# Patient Record
Sex: Female | Born: 2000 | Race: White | Hispanic: No | Marital: Single | Smoking: Never smoker
Health system: Southern US, Community
[De-identification: ages and names within clinical notes are randomized; demographics above are authoritative.]

---
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
## 2021-01-20 DIAGNOSIS — Z419 Encounter for procedure for purposes other than remedying health state, unspecified: Secondary | ICD-10-CM | POA: Diagnosis not present

## 2021-02-19 DIAGNOSIS — Z419 Encounter for procedure for purposes other than remedying health state, unspecified: Secondary | ICD-10-CM | POA: Diagnosis not present

## 2021-03-22 DIAGNOSIS — Z419 Encounter for procedure for purposes other than remedying health state, unspecified: Secondary | ICD-10-CM | POA: Diagnosis not present

## 2021-04-22 DIAGNOSIS — Z419 Encounter for procedure for purposes other than remedying health state, unspecified: Secondary | ICD-10-CM | POA: Diagnosis not present

## 2021-05-20 DIAGNOSIS — Z419 Encounter for procedure for purposes other than remedying health state, unspecified: Secondary | ICD-10-CM | POA: Diagnosis not present

## 2021-06-20 DIAGNOSIS — Z419 Encounter for procedure for purposes other than remedying health state, unspecified: Secondary | ICD-10-CM | POA: Diagnosis not present

## 2021-07-20 DIAGNOSIS — Z419 Encounter for procedure for purposes other than remedying health state, unspecified: Secondary | ICD-10-CM | POA: Diagnosis not present

## 2021-08-20 DIAGNOSIS — Z419 Encounter for procedure for purposes other than remedying health state, unspecified: Secondary | ICD-10-CM | POA: Diagnosis not present

## 2021-09-19 DIAGNOSIS — Z419 Encounter for procedure for purposes other than remedying health state, unspecified: Secondary | ICD-10-CM | POA: Diagnosis not present

## 2021-10-20 DIAGNOSIS — Z419 Encounter for procedure for purposes other than remedying health state, unspecified: Secondary | ICD-10-CM | POA: Diagnosis not present

## 2021-11-20 DIAGNOSIS — Z419 Encounter for procedure for purposes other than remedying health state, unspecified: Secondary | ICD-10-CM | POA: Diagnosis not present

## 2021-12-20 DIAGNOSIS — Z419 Encounter for procedure for purposes other than remedying health state, unspecified: Secondary | ICD-10-CM | POA: Diagnosis not present

## 2022-01-20 DIAGNOSIS — Z419 Encounter for procedure for purposes other than remedying health state, unspecified: Secondary | ICD-10-CM | POA: Diagnosis not present

## 2022-02-19 DIAGNOSIS — Z419 Encounter for procedure for purposes other than remedying health state, unspecified: Secondary | ICD-10-CM | POA: Diagnosis not present

## 2022-03-22 DIAGNOSIS — Z419 Encounter for procedure for purposes other than remedying health state, unspecified: Secondary | ICD-10-CM | POA: Diagnosis not present

## 2022-04-22 DIAGNOSIS — Z419 Encounter for procedure for purposes other than remedying health state, unspecified: Secondary | ICD-10-CM | POA: Diagnosis not present

## 2022-05-21 DIAGNOSIS — Z419 Encounter for procedure for purposes other than remedying health state, unspecified: Secondary | ICD-10-CM | POA: Diagnosis not present

## 2022-06-21 DIAGNOSIS — Z419 Encounter for procedure for purposes other than remedying health state, unspecified: Secondary | ICD-10-CM | POA: Diagnosis not present

## 2022-07-21 DIAGNOSIS — Z419 Encounter for procedure for purposes other than remedying health state, unspecified: Secondary | ICD-10-CM | POA: Diagnosis not present

## 2022-08-21 DIAGNOSIS — Z419 Encounter for procedure for purposes other than remedying health state, unspecified: Secondary | ICD-10-CM | POA: Diagnosis not present

## 2022-09-20 DIAGNOSIS — Z419 Encounter for procedure for purposes other than remedying health state, unspecified: Secondary | ICD-10-CM | POA: Diagnosis not present

## 2022-10-21 DIAGNOSIS — Z419 Encounter for procedure for purposes other than remedying health state, unspecified: Secondary | ICD-10-CM | POA: Diagnosis not present

## 2022-11-21 DIAGNOSIS — Z419 Encounter for procedure for purposes other than remedying health state, unspecified: Secondary | ICD-10-CM | POA: Diagnosis not present

## 2022-12-21 DIAGNOSIS — Z419 Encounter for procedure for purposes other than remedying health state, unspecified: Secondary | ICD-10-CM | POA: Diagnosis not present

## 2023-01-21 DIAGNOSIS — Z419 Encounter for procedure for purposes other than remedying health state, unspecified: Secondary | ICD-10-CM | POA: Diagnosis not present

## 2023-02-20 DIAGNOSIS — Z419 Encounter for procedure for purposes other than remedying health state, unspecified: Secondary | ICD-10-CM | POA: Diagnosis not present

## 2023-03-23 DIAGNOSIS — Z419 Encounter for procedure for purposes other than remedying health state, unspecified: Secondary | ICD-10-CM | POA: Diagnosis not present

## 2023-04-23 DIAGNOSIS — Z419 Encounter for procedure for purposes other than remedying health state, unspecified: Secondary | ICD-10-CM | POA: Diagnosis not present

## 2023-05-21 DIAGNOSIS — Z419 Encounter for procedure for purposes other than remedying health state, unspecified: Secondary | ICD-10-CM | POA: Diagnosis not present

## 2023-07-02 DIAGNOSIS — Z419 Encounter for procedure for purposes other than remedying health state, unspecified: Secondary | ICD-10-CM | POA: Diagnosis not present

## 2023-08-01 DIAGNOSIS — Z419 Encounter for procedure for purposes other than remedying health state, unspecified: Secondary | ICD-10-CM | POA: Diagnosis not present

## 2023-09-01 DIAGNOSIS — Z419 Encounter for procedure for purposes other than remedying health state, unspecified: Secondary | ICD-10-CM | POA: Diagnosis not present

## 2023-10-01 DIAGNOSIS — Z419 Encounter for procedure for purposes other than remedying health state, unspecified: Secondary | ICD-10-CM | POA: Diagnosis not present

## 2023-11-01 DIAGNOSIS — Z419 Encounter for procedure for purposes other than remedying health state, unspecified: Secondary | ICD-10-CM | POA: Diagnosis not present

## 2023-12-02 DIAGNOSIS — Z419 Encounter for procedure for purposes other than remedying health state, unspecified: Secondary | ICD-10-CM | POA: Diagnosis not present

## 2023-12-06 DIAGNOSIS — R1030 Lower abdominal pain, unspecified: Secondary | ICD-10-CM | POA: Diagnosis not present

## 2023-12-06 DIAGNOSIS — O99891 Other specified diseases and conditions complicating pregnancy: Secondary | ICD-10-CM | POA: Diagnosis not present

## 2023-12-06 DIAGNOSIS — O26891 Other specified pregnancy related conditions, first trimester: Secondary | ICD-10-CM | POA: Diagnosis not present

## 2023-12-06 DIAGNOSIS — Z3A00 Weeks of gestation of pregnancy not specified: Secondary | ICD-10-CM | POA: Diagnosis not present

## 2023-12-06 DIAGNOSIS — Z3A01 Less than 8 weeks gestation of pregnancy: Secondary | ICD-10-CM | POA: Diagnosis not present

## 2023-12-12 DIAGNOSIS — Z0183 Encounter for blood typing: Secondary | ICD-10-CM | POA: Diagnosis not present

## 2024-03-02 DIAGNOSIS — Z419 Encounter for procedure for purposes other than remedying health state, unspecified: Secondary | ICD-10-CM | POA: Diagnosis not present
